# Patient Record
Sex: Female | Born: 1965 | Hispanic: No | Marital: Married | Smoking: Never smoker
Health system: Southern US, Community
[De-identification: ages and names within clinical notes are randomized; demographics above are authoritative.]

---

## 2005-08-01 ENCOUNTER — Other Ambulatory Visit: Admission: RE | Admit: 2005-08-01 | Discharge: 2005-08-01 | Payer: Self-pay | Admitting: Obstetrics and Gynecology

## 2006-02-15 ENCOUNTER — Inpatient Hospital Stay (HOSPITAL_COMMUNITY): Admission: RE | Admit: 2006-02-15 | Discharge: 2006-02-18 | Payer: Self-pay | Admitting: Obstetrics and Gynecology

## 2006-09-21 ENCOUNTER — Emergency Department (HOSPITAL_COMMUNITY): Admission: EM | Admit: 2006-09-21 | Discharge: 2006-09-21 | Payer: Self-pay | Admitting: Family Medicine

## 2007-01-18 ENCOUNTER — Other Ambulatory Visit: Admission: RE | Admit: 2007-01-18 | Discharge: 2007-01-18 | Payer: Self-pay | Admitting: Obstetrics and Gynecology

## 2007-04-10 ENCOUNTER — Encounter: Admission: RE | Admit: 2007-04-10 | Discharge: 2007-04-10 | Payer: Self-pay | Admitting: Family Medicine

## 2008-02-25 ENCOUNTER — Other Ambulatory Visit: Admission: RE | Admit: 2008-02-25 | Discharge: 2008-02-25 | Payer: Self-pay | Admitting: Obstetrics and Gynecology

## 2008-03-26 ENCOUNTER — Encounter: Admission: RE | Admit: 2008-03-26 | Discharge: 2008-03-26 | Payer: Self-pay | Admitting: Obstetrics and Gynecology

## 2008-05-06 ENCOUNTER — Encounter: Admission: RE | Admit: 2008-05-06 | Discharge: 2008-05-06 | Payer: Self-pay | Admitting: Obstetrics and Gynecology

## 2008-10-28 ENCOUNTER — Encounter: Admission: RE | Admit: 2008-10-28 | Discharge: 2008-10-28 | Payer: Self-pay | Admitting: Obstetrics and Gynecology

## 2009-05-21 ENCOUNTER — Other Ambulatory Visit: Admission: RE | Admit: 2009-05-21 | Discharge: 2009-05-21 | Payer: Self-pay | Admitting: Obstetrics and Gynecology

## 2009-06-30 ENCOUNTER — Encounter: Admission: RE | Admit: 2009-06-30 | Discharge: 2009-06-30 | Payer: Self-pay | Admitting: Obstetrics and Gynecology

## 2010-08-04 ENCOUNTER — Encounter: Admission: RE | Admit: 2010-08-04 | Discharge: 2010-08-04 | Payer: Self-pay | Admitting: Obstetrics and Gynecology

## 2010-11-28 ENCOUNTER — Encounter: Payer: Self-pay | Admitting: Obstetrics and Gynecology

## 2011-03-25 NOTE — Op Note (Signed)
Kelsey Hodges, Kelsey Hodges               ACCOUNT NO.:  [id number]   MEDICAL RECORD NO.:  [id number]          PATIENT TYPE:  INP   LOCATION:  9198                          FACILITY:  [facility]   PHYSICIAN:  Gerald Leitz, MD          DATE OF BIRTH:  November 11, 1965   DATE OF PROCEDURE:  02/15/2006  DATE OF DISCHARGE:                                 OPERATIVE REPORT   PREOPERATIVE DIAGNOSIS:  A 39-week intrauterine pregnancy with history of  cesarean section, desires repeat cesarean section.   POSTOPERATIVE DIAGNOSIS:  A 39-week intrauterine pregnancy with history of  cesarean section, desires repeat cesarean section.   OPERATION PERFORMED:  Low transverse cesarean section via Pfannenstiel skin  incision.   SURGEON:  Gerald Leitz, MD   ASSISTANT:  Bing Neighbors. Delcambre, MD   ANESTHESIA:  Spinal.   COMPLICATIONS:  None.   SPECIMENS:  None.   ESTIMATED BLOOD LOSS:  600 mL.   URINE OUTPUT:  800 mL.   FLUIDS REPLACED:  2500 mL lactated Ringers.   DESCRIPTION OF PROCEDURE:  The patient was taken to the operating room where  spinal anesthesia was placed and found to be adequate.  She was then prepped  and draped in the usual sterile fashion.  A Pfannenstiel incision was made  with a scalpel and carried down to the underlying layer of fascia.  The  fascia was incised in the midline.  The incision was extended laterally with  Mayo scissors.  The superior aspect of the fascial incision was grasped with  Kocher clamps, elevated and the underlying rectus muscles were dissected off  with the scalpel and Mayo scissors.  The inferior aspect of the fascial  incision was grasped in a similar fashion and underlying rectus muscles were  dissected off with Mayo scissors.  The peritoneum was identified and entered  sharply with Metzenbaum scissors.  The incision was extended superiorly and  inferiorly with good visualization of the bladder.  The bladder blade was  inserted.  The vesicouterine peritoneum was  identified, grasped with pick  ups and entered sharply with Metzenbaum scissors.  The incision was extended  laterally and a bladder flap was created digitally.  The bladder blade was  then reinserted.  Low transverse incision was made with the scalpel.  The  infant's head was delivered.  The mouth and nose were suctioned.  Nuchal  cord x 1 was reduced.  The rest of the body was delivered.  The cord was  clamped x 2 then cut and the infant was handed off to the waiting neonatal  team.  The placenta was expressed and removed manually.  The uterus was  exteriorized and cleared of all clot and debris.  The uterine incision was  repaired with 0 Monocryl in a running locked fashion.  Excellent hemostasis  was noted.  The ovaries were examined and appeared to be normal.  The uterus  was returned to the abdomen.  The abdomen was then irrigated.  The uterine  incision was again examined and found to be hemostatic.  The peritoneum was  closed  with 2-0 Vicryl in a running fashion.  The fascia was closed with 0  PDS in a running fashion.  The skin was closed with 4-0 Vicryl in a running  fashion.  Sponge, lap, needle and instruments were all correct times two.  She was then taken to recovery room awake and in stable condition.      Gerald Leitz, MD  Electronically Signed     TC/MEDQ  D:  02/15/2006  T:  02/15/2006  Job:  [id number]

## 2011-03-25 NOTE — H&P (Signed)
Kelsey Hodges, Kelsey Hodges               ACCOUNT NO.:  [id number]   MEDICAL RECORD NO.:  [id number]          PATIENT TYPE:  INP   LOCATION:  NA                            FACILITY:  [facility]   PHYSICIAN:  Gerald Leitz, MD          DATE OF BIRTH:  18-Jul-1966   DATE OF ADMISSION:  DATE OF DISCHARGE:                                HISTORY & PHYSICAL   Patient is scheduled for repeat cesarean section on February 15, 2006.   HISTORY OF PRESENT ILLNESS:  This is a 45 year old G7, P1-0-5-1, at 43 weeks  estimated gestational age based on last menstrual period of May 18, 2005,  confirmed by an eight-week ultrasound with estimated date of delivery February 22, 2006.  Pregnancy is complicated by history of cesarean section due to  cephalopelvic disproportion and advanced maternal age as well as history of  HSV.  Patient reports positive fetal movement, no leakage of fluid, no  vaginal bleeding, no contractions.  She desires a repeat cesarean section.   MEDICATIONS:  Prenatal vitamin, Zyrtec and Valtrex.   ALLERGIES:  NO KNOWN DRUG ALLERGIES.   PAST MEDICAL HISTORY:  Negative.   PAST GYNECOLOGY HISTORY:  HSV, no recent outbreaks, no history of abnormal  Pap smears.  Last Pap smear was August 01, 2005, and this was normal.   PAST SURGICAL HISTORY:  Cesarean section x1.   PAST OBSTETRICAL HISTORY:  Cesarean section x1 and EAB x5.   SOCIAL HISTORY:  Patient is married.  She denies tobacco, alcohol or illicit  drug use.   FAMILY HISTORY:  Noncontributory.   REVIEW OF SYSTEMS:  Negative except as stated in history of present illness.   PHYSICAL EXAMINATION:  VITAL SIGNS:  Fetal heart rate 150s, blood pressure  102/68, weight 191 pounds.  LUNGS:  Clear to auscultation bilaterally.  CARDIOVASCULAR:  Regular rate and rhythm.  ABDOMEN:  Gravid, nontender, nondistended with positive bowel sounds.  EXTREMITIES:  No clubbing, cyanosis, or edema.  PELVIC:  Cervix was closed on November 27, 2005.   PRENATAL  LABS:  Blood type A positive.  Antibody screen negative.  Rubella  immune.  Hepatitis B negative.  HIV negative.  Glucose challenge 81.  Hemoglobin on January 25, 2006, was 11.6.  GC, Chlamydia and group B Strep  performed on January 25, 2006, were all negative.   IMPRESSION AND PLAN:  A 39-1/7 week intrauterine pregnancy with history of  cesarean section and desires repeat cesarean section.  Risks, benefits, and  alternatives of cesarean section were discussed with the patient  including infection, bleeding, damage to bowel and bladder and surrounding  organs with need for further surgery, risk of transfusion, HIV, hepatitis B  and C were discussed.  Patient understands and wishes to proceed with repeat  cesarean section.  She desires transfusion if required as a life saving  measure.      Gerald Leitz, MD  Electronically Signed     TC/MEDQ  D:  02/09/2006  T:  02/09/2006  Job:  [id number]

## 2011-05-31 ENCOUNTER — Other Ambulatory Visit (HOSPITAL_COMMUNITY)
Admission: RE | Admit: 2011-05-31 | Discharge: 2011-05-31 | Disposition: A | Discharge status: Home / Self Care | Payer: BC Managed Care – PPO | Source: Ambulatory Visit | Attending: Obstetrics and Gynecology | Admitting: Obstetrics and Gynecology

## 2011-05-31 ENCOUNTER — Other Ambulatory Visit: Payer: Self-pay | Admitting: Obstetrics and Gynecology

## 2011-05-31 DIAGNOSIS — Z01419 Encounter for gynecological examination (general) (routine) without abnormal findings: Secondary | ICD-10-CM | POA: Insufficient documentation

## 2012-06-04 ENCOUNTER — Other Ambulatory Visit: Payer: Self-pay | Admitting: Obstetrics and Gynecology

## 2012-06-04 ENCOUNTER — Other Ambulatory Visit (HOSPITAL_COMMUNITY)
Admission: RE | Admit: 2012-06-04 | Discharge: 2012-06-04 | Disposition: A | Discharge status: Home / Self Care | Payer: BC Managed Care – PPO | Source: Ambulatory Visit | Attending: Obstetrics and Gynecology | Admitting: Obstetrics and Gynecology

## 2012-06-04 DIAGNOSIS — Z01419 Encounter for gynecological examination (general) (routine) without abnormal findings: Secondary | ICD-10-CM | POA: Insufficient documentation

## 2013-07-05 ENCOUNTER — Other Ambulatory Visit: Payer: Self-pay | Admitting: Obstetrics and Gynecology

## 2013-07-05 ENCOUNTER — Other Ambulatory Visit (HOSPITAL_COMMUNITY)
Admission: RE | Admit: 2013-07-05 | Discharge: 2013-07-05 | Disposition: A | Discharge status: Home / Self Care | Payer: BC Managed Care – PPO | Source: Ambulatory Visit | Attending: Obstetrics and Gynecology | Admitting: Obstetrics and Gynecology

## 2013-07-05 DIAGNOSIS — Z01419 Encounter for gynecological examination (general) (routine) without abnormal findings: Secondary | ICD-10-CM | POA: Insufficient documentation

## 2013-07-05 DIAGNOSIS — Z1151 Encounter for screening for human papillomavirus (HPV): Secondary | ICD-10-CM | POA: Insufficient documentation

## 2013-07-12 ENCOUNTER — Other Ambulatory Visit: Payer: Self-pay

## 2013-07-12 DIAGNOSIS — Z1231 Encounter for screening mammogram for malignant neoplasm of breast: Secondary | ICD-10-CM

## 2013-07-31 ENCOUNTER — Ambulatory Visit
Admission: RE | Admit: 2013-07-31 | Discharge: 2013-07-31 | Disposition: A | Discharge status: Home / Self Care | Payer: BC Managed Care – PPO | Source: Ambulatory Visit

## 2013-07-31 DIAGNOSIS — Z1231 Encounter for screening mammogram for malignant neoplasm of breast: Secondary | ICD-10-CM

## 2014-08-13 ENCOUNTER — Other Ambulatory Visit: Payer: Self-pay

## 2014-08-13 DIAGNOSIS — Z1239 Encounter for other screening for malignant neoplasm of breast: Secondary | ICD-10-CM

## 2014-08-28 ENCOUNTER — Other Ambulatory Visit: Payer: Self-pay

## 2014-08-28 DIAGNOSIS — Z1231 Encounter for screening mammogram for malignant neoplasm of breast: Secondary | ICD-10-CM

## 2014-09-01 ENCOUNTER — Encounter (INDEPENDENT_AMBULATORY_CARE_PROVIDER_SITE_OTHER): Payer: Self-pay

## 2014-09-01 ENCOUNTER — Ambulatory Visit
Admission: RE | Admit: 2014-09-01 | Discharge: 2014-09-01 | Disposition: A | Discharge status: Home / Self Care | Payer: BC Managed Care – PPO | Source: Ambulatory Visit

## 2014-09-01 DIAGNOSIS — Z1231 Encounter for screening mammogram for malignant neoplasm of breast: Secondary | ICD-10-CM

## 2014-09-28 ENCOUNTER — Emergency Department (HOSPITAL_COMMUNITY)
Admission: EM | Admit: 2014-09-28 | Discharge: 2014-09-28 | Disposition: A | Discharge status: Home / Self Care | Payer: BC Managed Care – PPO | Attending: Emergency Medicine | Admitting: Emergency Medicine

## 2014-09-28 ENCOUNTER — Encounter (HOSPITAL_COMMUNITY): Payer: Self-pay | Admitting: Emergency Medicine

## 2014-09-28 ENCOUNTER — Emergency Department (HOSPITAL_COMMUNITY): Payer: BC Managed Care – PPO

## 2014-09-28 DIAGNOSIS — S9031XA Contusion of right foot, initial encounter: Secondary | ICD-10-CM | POA: Insufficient documentation

## 2014-09-28 DIAGNOSIS — W1840XA Slipping, tripping and stumbling without falling, unspecified, initial encounter: Secondary | ICD-10-CM | POA: Diagnosis not present

## 2014-09-28 DIAGNOSIS — Y998 Other external cause status: Secondary | ICD-10-CM | POA: Insufficient documentation

## 2014-09-28 DIAGNOSIS — Y9389 Activity, other specified: Secondary | ICD-10-CM | POA: Insufficient documentation

## 2014-09-28 DIAGNOSIS — Y9289 Other specified places as the place of occurrence of the external cause: Secondary | ICD-10-CM | POA: Diagnosis not present

## 2014-09-28 DIAGNOSIS — S92401A Displaced unspecified fracture of right great toe, initial encounter for closed fracture: Secondary | ICD-10-CM

## 2014-09-28 DIAGNOSIS — S99921A Unspecified injury of right foot, initial encounter: Secondary | ICD-10-CM | POA: Diagnosis present

## 2014-09-28 DIAGNOSIS — S92411A Displaced fracture of proximal phalanx of right great toe, initial encounter for closed fracture: Secondary | ICD-10-CM | POA: Insufficient documentation

## 2014-09-28 MED ORDER — MELOXICAM 7.5 MG PO TABS: 7.5000 mg | ORAL_TABLET | Freq: Every day | ORAL | Status: AC

## 2014-09-28 MED ORDER — HYDROCODONE-ACETAMINOPHEN 5-325 MG PO TABS: 1.0000 | ORAL_TABLET | ORAL | Status: AC | PRN

## 2014-09-28 NOTE — ED Provider Notes (Signed)
CSN: [id number]     Arrival date & time 09/28/14  1905 History  This chart was scribed for Junius FinnerErin O'Malley, PA-C, working with Elwin MochaBlair Walden, MD by Chestine SporeSoijett Blue, ED Scribe. The patient was seen in room TR06C/TR06C at 8:08 PM.    Chief Complaint  Patient presents with  . Toe Pain     The history is provided by the patient. No language interpreter was used.   HPI Comments: Kelsey Hodges is a 48 y.o. female who presents to the Emergency Department complaining of right great toe pain onset PTA after tripping over something in the dark 25 min PTA. Pain is rated as 1/10. Pt is able to wiggle the toe. She states that she is having associated symptoms of joint swelling. She states that she has not tried any medication for the relief for her symptoms. She denies any other symptoms. Denies previous injuries to the area.   History reviewed. No pertinent past medical history. Past Surgical History  Procedure Laterality Date  . Cesarean section     No family history on file. History  Substance Use Topics  . Smoking status: Never Smoker   . Smokeless tobacco: Not on file  . Alcohol Use: No   OB History    No data available     Review of Systems  Musculoskeletal: Positive for joint swelling and arthralgias.      Allergies  Review of patient's allergies indicates not on file.  Home Medications   Prior to Admission medications   Medication Sig Start Date End Date Taking? Authorizing Provider  HYDROcodone-acetaminophen (NORCO/VICODIN) 5-325 MG per tablet Take 1-2 tablets by mouth every 4 (four) hours as needed for moderate pain or severe pain. 09/28/14   Junius FinnerErin O'Malley, PA-C  meloxicam (MOBIC) 7.5 MG tablet Take 1 tablet (7.5 mg total) by mouth daily. 09/28/14   Junius FinnerErin O'Malley, PA-C   BP 120/72 mmHg  Pulse 74  Temp(Src) 98.6 F (37 C)  Resp 16  Ht 5\' 9"  (1.753 m)  Wt 165 lb (74.844 kg)  BMI 24.36 kg/m2  SpO2 97%  LMP 09/28/2014 Physical Exam  Constitutional: She is oriented to  person, place, and time. She appears well-developed and well-nourished. No distress.  HENT:  Head: Normocephalic and atraumatic.  Eyes: EOM are normal.  Neck: Neck supple. No tracheal deviation present.  Cardiovascular: Normal rate.   Pulses:      Dorsalis pedis pulses are 2+ on the right side.       Posterior tibial pulses are 2+ on the right side.  Cap refill <3 seconds  Pulmonary/Chest: Effort normal. No respiratory distress.  Musculoskeletal: Normal range of motion.       Right foot: There is tenderness. There is normal range of motion.  Mild ecchymosis with tenderness of the first MTP joint. Full ROM. No edema noted.   Neurological: She is alert and oriented to person, place, and time.  Skin: Skin is warm and dry.  Psychiatric: She has a normal mood and affect. Her behavior is normal.  Nursing note and vitals reviewed.   ED Course  Procedures (including critical care time) DIAGNOSTIC STUDIES: Oxygen Saturation is 97% on room air, normal by my interpretation.    COORDINATION OF CARE: 8:13 PM-Discussed treatment plan which includes right foot X-Ray with pt at bedside and pt agreed to plan.   8:48 PM- Spoke with Dr. Roda ShuttersXu from Orthopedics and was informed to have the pt use Post-op boot, only weight bearing on the heel, f/u this week  for cast.    Labs Review Labs Reviewed - No data to display  Imaging Review Dg Foot Complete Right  09/28/2014   CLINICAL DATA:  Tripped and fell.  EXAM: RIGHT FOOT COMPLETE - 3+ VIEW  COMPARISON:  None  FINDINGS: There is a comminuted, intra-articular fracture involving the base of the first proximal phalanx. Mild dorsal angulation of the distal fracture fragments noted. No additional fractures identified.  IMPRESSION: Acute comminuted, intra-articular fracture involves the base of the first proximal phalanx.   Electronically Signed   By: Signa Kellaylor  Stroud M.D.   On: 09/28/2014 20:33     EKG Interpretation None      MDM   Final diagnoses:   Fractured great toe, right, closed, initial encounter    Pt presenting to ED with right great toe pain. Neurovascularly in tact. Plain films: acute comminuted intra-articular fx, base of first proximal phalanx. Consulted with Dr. Roda ShuttersXu, will place in post-op shoe. Crutches and pain medication provided. Advised to call Dr. Roda ShuttersXu office in the morning to schedule f/u appointment. Home care instructions provided. Pt verbalized understanding and agreement with tx plan.   I personally performed the services described in this documentation, which was scribed in my presence. The recorded information has been reviewed and is accurate.    Junius Finnerrin O'Malley, PA-C 09/29/14 0028  Elwin MochaBlair Walden, MD 09/30/14 [phone]

## 2014-09-28 NOTE — Discharge Instructions (Signed)

## 2014-09-28 NOTE — Progress Notes (Signed)
Orthopedic Tech Progress Note Patient Details:  Kelsey FussRachel Hodges 25-May-1966 [id number] Applied post-op shoe to RLE.  Fit pt. for crutches and taught use of same. Ortho Devices Type of Ortho Device: Postop shoe/boot, Crutches Ortho Device/Splint Location: RLE Ortho Device/Splint Interventions: Application   Lesle ChrisGilliland, Lalah Durango L 09/28/2014, 9:50 PM

## 2014-09-28 NOTE — ED Notes (Cosign Needed Addendum)
Pt states she tripped over something in the dark approx 25 min ago and fell.  C/o R great toe pain and abrasion to R anterior wrist.  Denies LOC.  Denies neck and back pain.  Denies pain to R wrist.  Unknown last DT.

## 2017-06-01 ENCOUNTER — Other Ambulatory Visit (HOSPITAL_COMMUNITY)
Admission: RE | Admit: 2017-06-01 | Discharge: 2017-06-01 | Disposition: A | Discharge status: Home / Self Care | Payer: BC Managed Care – PPO | Source: Ambulatory Visit | Attending: Nurse Practitioner | Admitting: Nurse Practitioner

## 2017-06-01 ENCOUNTER — Other Ambulatory Visit: Payer: Self-pay | Admitting: Nurse Practitioner

## 2017-06-01 DIAGNOSIS — Z1231 Encounter for screening mammogram for malignant neoplasm of breast: Secondary | ICD-10-CM

## 2017-06-01 DIAGNOSIS — Z124 Encounter for screening for malignant neoplasm of cervix: Secondary | ICD-10-CM | POA: Diagnosis not present

## 2017-06-05 LAB — CYTOLOGY - PAP
DIAGNOSIS: NEGATIVE
HPV: NOT DETECTED

## 2017-06-19 ENCOUNTER — Ambulatory Visit
Admission: RE | Admit: 2017-06-19 | Discharge: 2017-06-19 | Disposition: A | Discharge status: Home / Self Care | Payer: BC Managed Care – PPO | Source: Ambulatory Visit | Attending: Nurse Practitioner | Admitting: Nurse Practitioner

## 2017-06-19 DIAGNOSIS — Z1231 Encounter for screening mammogram for malignant neoplasm of breast: Secondary | ICD-10-CM

## 2019-03-22 ENCOUNTER — Other Ambulatory Visit: Payer: Self-pay | Admitting: Obstetrics and Gynecology

## 2019-03-22 DIAGNOSIS — Z1231 Encounter for screening mammogram for malignant neoplasm of breast: Secondary | ICD-10-CM

## 2019-05-15 ENCOUNTER — Ambulatory Visit
Admission: RE | Admit: 2019-05-15 | Discharge: 2019-05-15 | Disposition: A | Discharge status: Home / Self Care | Payer: BC Managed Care – PPO | Source: Ambulatory Visit | Attending: Obstetrics and Gynecology | Admitting: Obstetrics and Gynecology

## 2019-05-15 ENCOUNTER — Other Ambulatory Visit: Payer: Self-pay

## 2019-05-15 DIAGNOSIS — Z1231 Encounter for screening mammogram for malignant neoplasm of breast: Secondary | ICD-10-CM

## 2020-01-04 ENCOUNTER — Ambulatory Visit: Payer: BC Managed Care – PPO | Attending: Internal Medicine

## 2020-08-12 ENCOUNTER — Other Ambulatory Visit: Payer: Self-pay | Admitting: Obstetrics and Gynecology

## 2020-08-12 DIAGNOSIS — Z1231 Encounter for screening mammogram for malignant neoplasm of breast: Secondary | ICD-10-CM

## 2020-09-02 ENCOUNTER — Ambulatory Visit
Admission: RE | Admit: 2020-09-02 | Discharge: 2020-09-02 | Disposition: A | Discharge status: Home / Self Care | Payer: BC Managed Care – PPO | Source: Ambulatory Visit | Attending: Obstetrics and Gynecology | Admitting: Obstetrics and Gynecology

## 2020-09-02 ENCOUNTER — Other Ambulatory Visit: Payer: Self-pay

## 2020-09-02 DIAGNOSIS — Z1231 Encounter for screening mammogram for malignant neoplasm of breast: Secondary | ICD-10-CM

## 2021-09-27 ENCOUNTER — Other Ambulatory Visit: Payer: Self-pay | Admitting: Obstetrics and Gynecology

## 2021-09-27 DIAGNOSIS — Z1231 Encounter for screening mammogram for malignant neoplasm of breast: Secondary | ICD-10-CM

## 2021-09-29 ENCOUNTER — Ambulatory Visit
Admission: RE | Admit: 2021-09-29 | Discharge: 2021-09-29 | Disposition: A | Discharge status: Home / Self Care | Payer: BC Managed Care – PPO | Source: Ambulatory Visit | Attending: Obstetrics and Gynecology | Admitting: Obstetrics and Gynecology

## 2021-09-29 DIAGNOSIS — Z1231 Encounter for screening mammogram for malignant neoplasm of breast: Secondary | ICD-10-CM

## 2022-12-09 ENCOUNTER — Other Ambulatory Visit: Payer: Self-pay | Admitting: Family Medicine

## 2022-12-09 DIAGNOSIS — Z1231 Encounter for screening mammogram for malignant neoplasm of breast: Secondary | ICD-10-CM

## 2023-01-18 ENCOUNTER — Ambulatory Visit
Admission: RE | Admit: 2023-01-18 | Discharge: 2023-01-18 | Disposition: A | Discharge status: Home / Self Care | Payer: BC Managed Care – PPO | Source: Ambulatory Visit | Attending: Family Medicine | Admitting: Family Medicine

## 2023-01-18 DIAGNOSIS — Z1231 Encounter for screening mammogram for malignant neoplasm of breast: Secondary | ICD-10-CM

## 2023-10-15 IMAGING — MG MM DIGITAL SCREENING BILAT W/ TOMO AND CAD
8 series · 8 of 24 positions shown · non-contrast
Comparison: Previous exam(s).

CLINICAL DATA: Screening.

EXAM:
DIGITAL SCREENING BILATERAL MAMMOGRAM WITH TOMOSYNTHESIS AND CAD
TECHNIQUE: Bilateral screening digital craniocaudal and mediolateral oblique
mammograms were obtained. Bilateral screening digital breast
tomosynthesis was performed. The images were evaluated with
computer-aided detection.

[L MLO synth-2D]
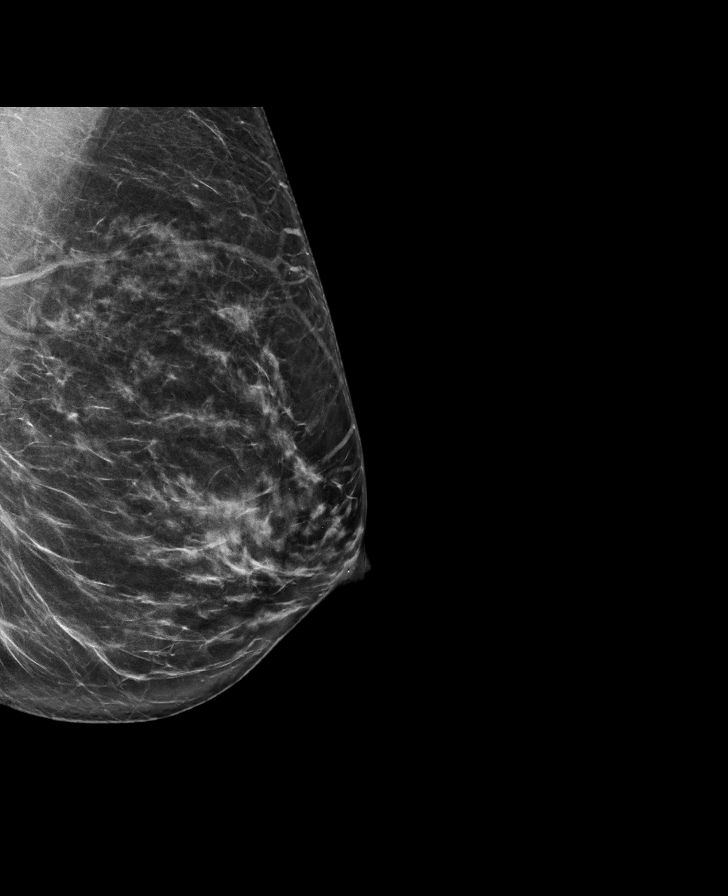

[L CC synth-2D]
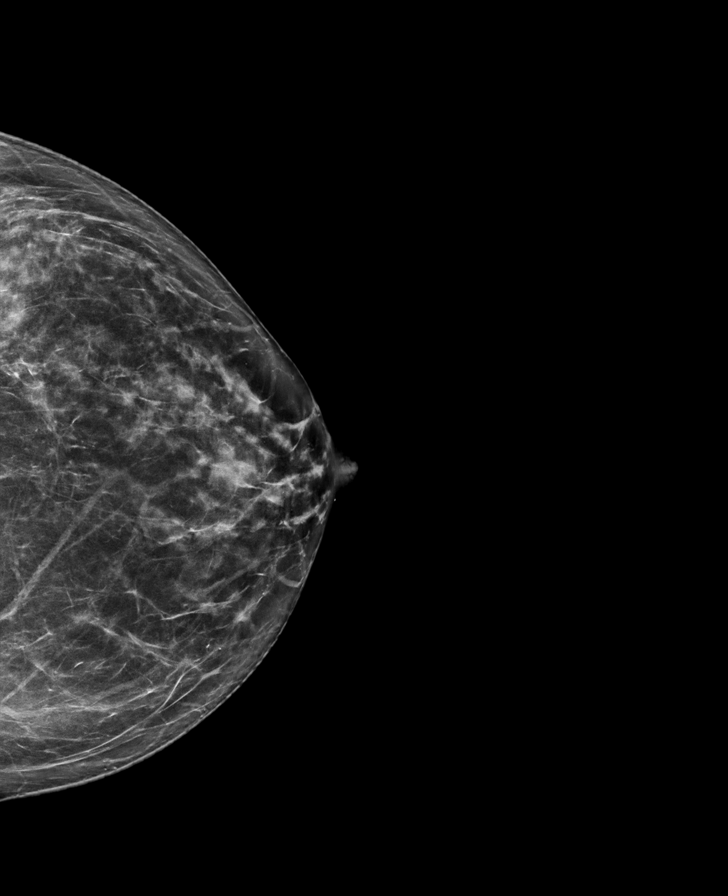

[R CC synth-2D]
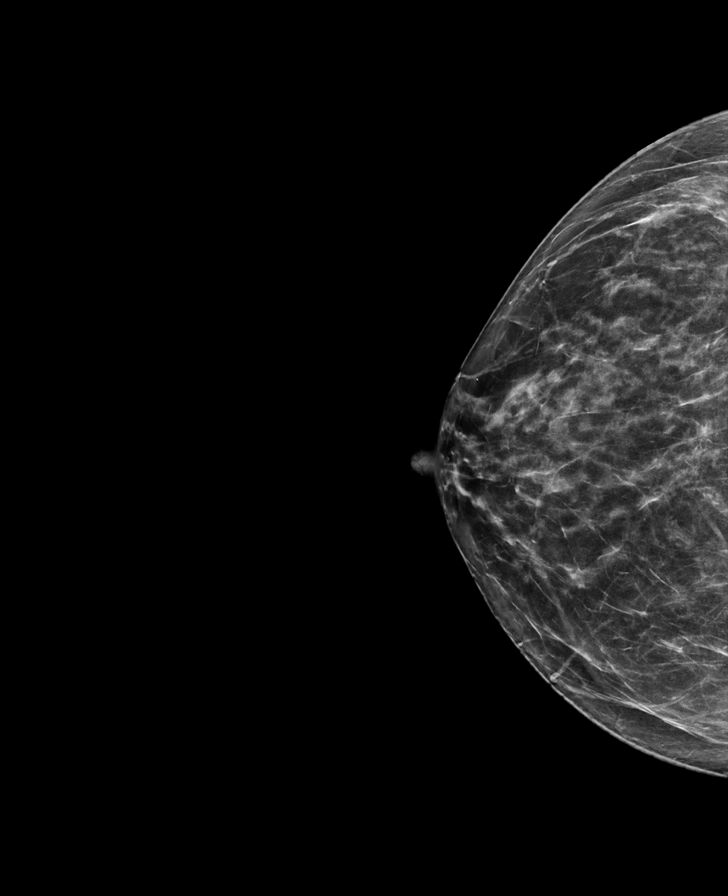

[R MLO synth-2D]
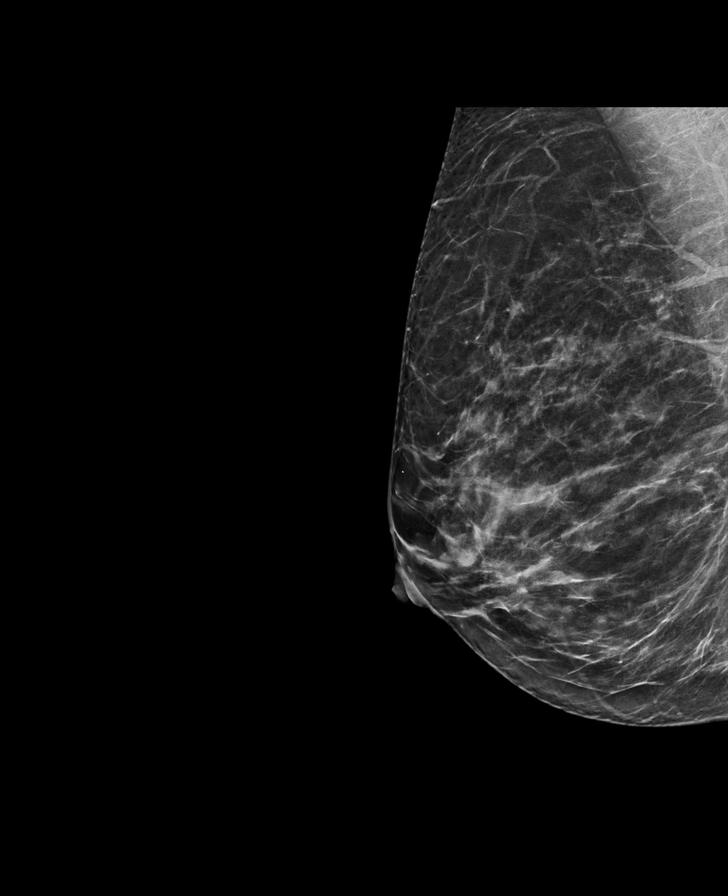

[L MLO tomo · tomo slice 39/76.0]
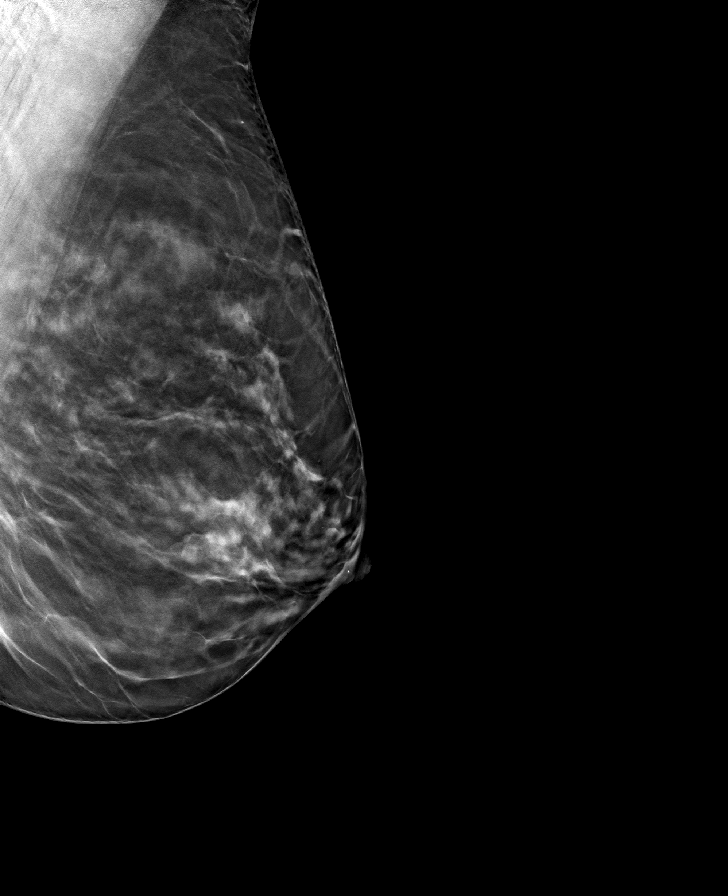

[R MLO tomo · tomo slice 35/69.0]
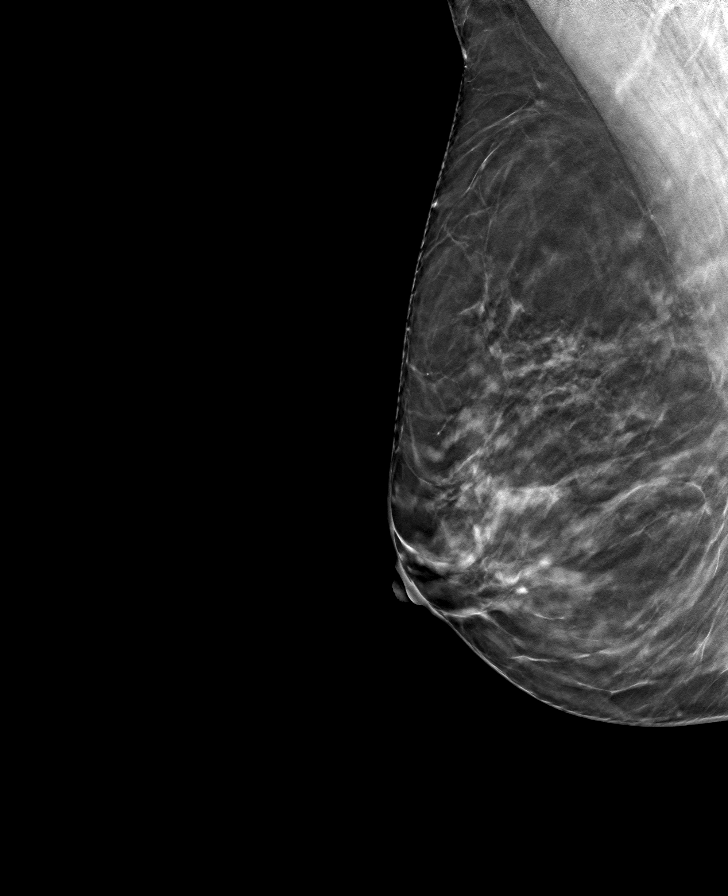

[L CC tomo · tomo slice 37/74.0]
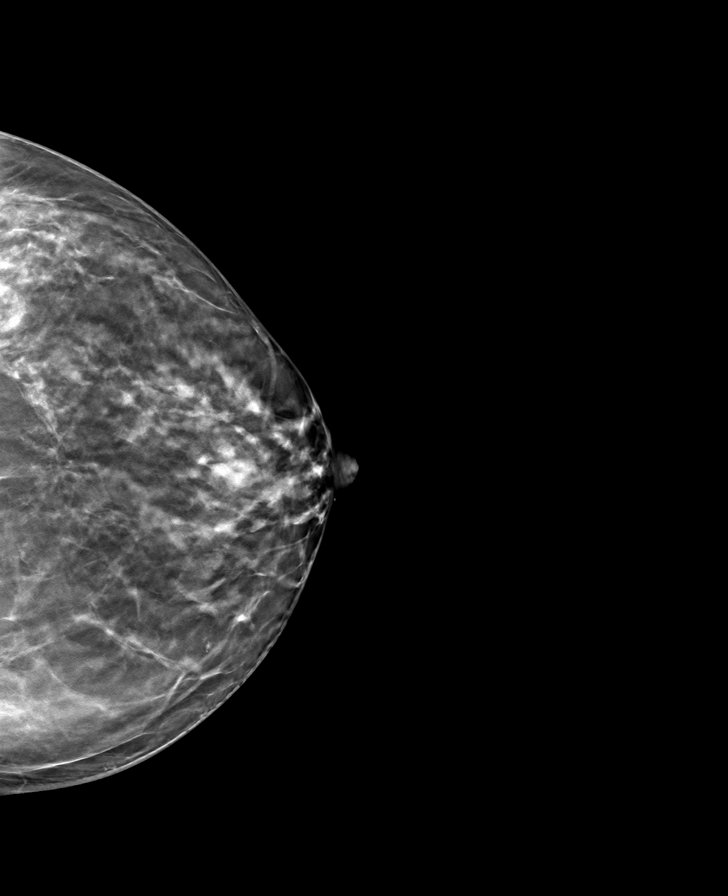

[R CC tomo · tomo slice 35/68.0]
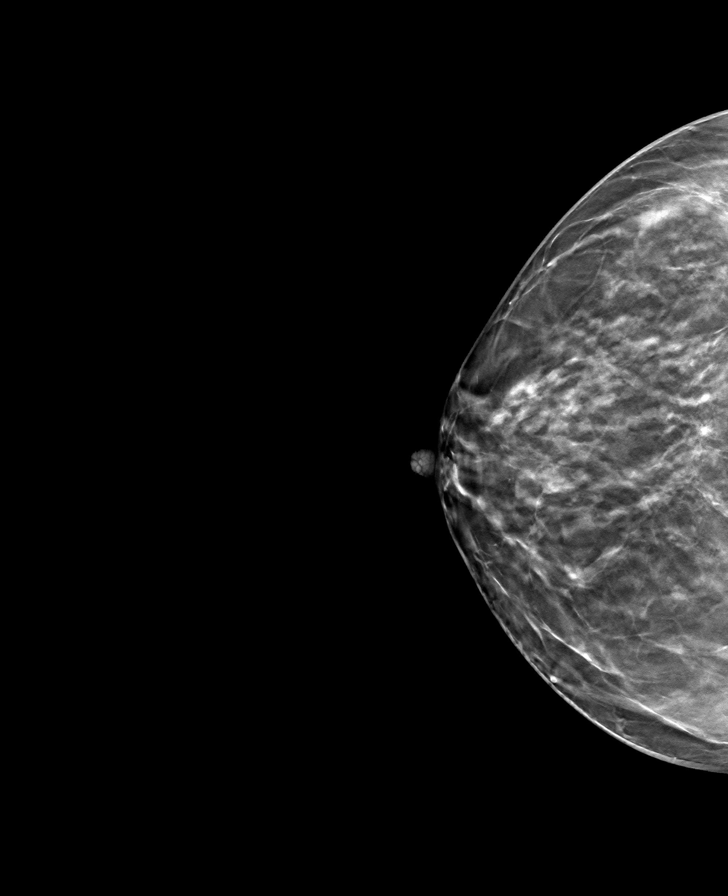

[8 of 24 positions shown; findings below may reference images not displayed]

ACR Breast Density Category c: The breast tissue is heterogeneously
dense, which may obscure small masses.
FINDINGS: There are no findings suspicious for malignancy.
IMPRESSION: No mammographic evidence of malignancy. A result letter of this
screening mammogram will be mailed directly to the patient.

RECOMMENDATION:
Screening mammogram in one year. (Code:Q3-W-BC3)

BI-RADS CATEGORY  1: Negative.
